# Patient Record
Sex: Female | Born: 1935 | Race: White | Hispanic: No | Marital: Married
Health system: Southern US, Community
[De-identification: ages and names within clinical notes are randomized; demographics above are authoritative.]

---

## 2005-11-30 ENCOUNTER — Inpatient Hospital Stay: Payer: Self-pay | Admitting: Internal Medicine

## 2005-12-02 ENCOUNTER — Other Ambulatory Visit: Payer: Self-pay

## 2005-12-10 ENCOUNTER — Other Ambulatory Visit: Payer: Self-pay

## 2006-02-17 ENCOUNTER — Encounter: Payer: Self-pay | Admitting: Internal Medicine

## 2006-03-07 ENCOUNTER — Ambulatory Visit: Payer: Self-pay | Admitting: Internal Medicine

## 2006-03-12 ENCOUNTER — Encounter: Payer: Self-pay | Admitting: Internal Medicine

## 2006-04-12 ENCOUNTER — Encounter: Payer: Self-pay | Admitting: Internal Medicine

## 2006-05-13 ENCOUNTER — Encounter: Payer: Self-pay | Admitting: Internal Medicine

## 2006-06-11 ENCOUNTER — Encounter: Payer: Self-pay | Admitting: Internal Medicine

## 2006-07-12 ENCOUNTER — Encounter: Payer: Self-pay | Admitting: Internal Medicine

## 2006-08-11 ENCOUNTER — Encounter: Payer: Self-pay | Admitting: Internal Medicine

## 2006-09-11 ENCOUNTER — Encounter: Payer: Self-pay | Admitting: Internal Medicine

## 2006-10-11 ENCOUNTER — Encounter: Payer: Self-pay | Admitting: Internal Medicine

## 2006-11-11 ENCOUNTER — Encounter: Payer: Self-pay | Admitting: Internal Medicine

## 2006-12-12 ENCOUNTER — Encounter: Payer: Self-pay | Admitting: Internal Medicine

## 2007-01-11 ENCOUNTER — Encounter: Payer: Self-pay | Admitting: Internal Medicine

## 2007-02-11 ENCOUNTER — Encounter: Payer: Self-pay | Admitting: Internal Medicine

## 2007-03-13 ENCOUNTER — Encounter: Payer: Self-pay | Admitting: Internal Medicine

## 2007-04-13 ENCOUNTER — Encounter: Payer: Self-pay | Admitting: Internal Medicine

## 2007-05-14 ENCOUNTER — Encounter: Payer: Self-pay | Admitting: Internal Medicine

## 2007-06-11 ENCOUNTER — Encounter: Payer: Self-pay | Admitting: Internal Medicine

## 2007-07-12 ENCOUNTER — Encounter: Payer: Self-pay | Admitting: Internal Medicine

## 2007-08-11 ENCOUNTER — Encounter: Payer: Self-pay | Admitting: Internal Medicine

## 2007-09-11 ENCOUNTER — Encounter: Payer: Self-pay | Admitting: Internal Medicine

## 2007-10-11 ENCOUNTER — Encounter: Payer: Self-pay | Admitting: Internal Medicine

## 2007-11-11 ENCOUNTER — Encounter: Payer: Self-pay | Admitting: Internal Medicine

## 2007-12-12 ENCOUNTER — Encounter: Payer: Self-pay | Admitting: Internal Medicine

## 2008-01-11 ENCOUNTER — Encounter: Payer: Self-pay | Admitting: Internal Medicine

## 2008-02-11 ENCOUNTER — Encounter: Payer: Self-pay | Admitting: Internal Medicine

## 2008-03-12 ENCOUNTER — Encounter: Payer: Self-pay | Admitting: Internal Medicine

## 2008-04-12 ENCOUNTER — Encounter: Payer: Self-pay | Admitting: Internal Medicine

## 2008-05-13 ENCOUNTER — Encounter: Payer: Self-pay | Admitting: Internal Medicine

## 2008-05-29 ENCOUNTER — Ambulatory Visit: Payer: Self-pay | Admitting: Internal Medicine

## 2008-06-10 ENCOUNTER — Encounter: Payer: Self-pay | Admitting: Internal Medicine

## 2008-07-11 ENCOUNTER — Encounter: Payer: Self-pay | Admitting: Internal Medicine

## 2008-08-10 ENCOUNTER — Encounter: Payer: Self-pay | Admitting: Internal Medicine

## 2008-09-10 ENCOUNTER — Encounter: Payer: Self-pay | Admitting: Internal Medicine

## 2008-10-10 ENCOUNTER — Encounter: Payer: Self-pay | Admitting: Internal Medicine

## 2008-11-10 ENCOUNTER — Encounter: Payer: Self-pay | Admitting: Internal Medicine

## 2008-12-11 ENCOUNTER — Encounter: Payer: Self-pay | Admitting: Internal Medicine

## 2009-01-10 ENCOUNTER — Encounter: Payer: Self-pay | Admitting: Internal Medicine

## 2009-02-10 ENCOUNTER — Encounter: Payer: Self-pay | Admitting: Internal Medicine

## 2009-03-12 ENCOUNTER — Encounter: Payer: Self-pay | Admitting: Internal Medicine

## 2009-04-12 ENCOUNTER — Encounter: Payer: Self-pay | Admitting: Internal Medicine

## 2009-05-13 ENCOUNTER — Encounter: Payer: Self-pay | Admitting: Internal Medicine

## 2009-06-10 ENCOUNTER — Encounter: Payer: Self-pay | Admitting: Internal Medicine

## 2009-07-11 ENCOUNTER — Encounter: Payer: Self-pay | Admitting: Internal Medicine

## 2009-08-10 ENCOUNTER — Encounter: Payer: Self-pay | Admitting: Internal Medicine

## 2009-08-12 ENCOUNTER — Ambulatory Visit: Payer: Self-pay | Admitting: Ophthalmology

## 2009-08-26 ENCOUNTER — Ambulatory Visit: Payer: Self-pay | Admitting: Ophthalmology

## 2009-09-10 ENCOUNTER — Encounter: Payer: Self-pay | Admitting: Internal Medicine

## 2009-10-10 ENCOUNTER — Encounter: Payer: Self-pay | Admitting: Internal Medicine

## 2009-11-10 ENCOUNTER — Encounter: Payer: Self-pay | Admitting: Internal Medicine

## 2009-12-11 ENCOUNTER — Encounter: Payer: Self-pay | Admitting: Internal Medicine

## 2010-01-10 ENCOUNTER — Encounter: Payer: Self-pay | Admitting: Internal Medicine

## 2010-02-10 ENCOUNTER — Encounter: Payer: Self-pay | Admitting: Internal Medicine

## 2010-03-12 ENCOUNTER — Encounter: Payer: Self-pay | Admitting: Internal Medicine

## 2010-04-12 ENCOUNTER — Encounter: Payer: Self-pay | Admitting: Internal Medicine

## 2010-05-13 ENCOUNTER — Encounter: Payer: Self-pay | Admitting: Internal Medicine

## 2010-06-11 ENCOUNTER — Encounter: Payer: Self-pay | Admitting: Internal Medicine

## 2010-07-12 ENCOUNTER — Encounter: Payer: Self-pay | Admitting: Internal Medicine

## 2010-08-11 ENCOUNTER — Encounter: Payer: Self-pay | Admitting: Internal Medicine

## 2010-09-11 ENCOUNTER — Encounter: Payer: Self-pay | Admitting: Internal Medicine

## 2010-10-11 ENCOUNTER — Encounter: Payer: Self-pay | Admitting: Internal Medicine

## 2010-11-11 ENCOUNTER — Encounter: Payer: Self-pay | Admitting: Internal Medicine

## 2010-12-12 ENCOUNTER — Encounter: Payer: Self-pay | Admitting: Internal Medicine

## 2011-01-11 ENCOUNTER — Encounter: Payer: Self-pay | Admitting: Internal Medicine

## 2011-02-11 ENCOUNTER — Encounter: Payer: Self-pay | Admitting: Internal Medicine

## 2011-03-13 ENCOUNTER — Encounter: Payer: Self-pay | Admitting: Internal Medicine

## 2011-04-13 ENCOUNTER — Encounter: Payer: Self-pay | Admitting: Internal Medicine

## 2011-05-14 ENCOUNTER — Encounter: Payer: Self-pay | Admitting: Internal Medicine

## 2011-06-11 ENCOUNTER — Encounter: Payer: Self-pay | Admitting: Internal Medicine

## 2011-06-22 LAB — BASIC METABOLIC PANEL
Anion Gap: 10 (ref 7–16)
BUN: 10 mg/dL (ref 7–18)
Chloride: 105 mmol/L (ref 98–107)
Co2: 27 mmol/L (ref 21–32)
Creatinine: 0.8 mg/dL (ref 0.60–1.30)
Osmolality: 282 (ref 275–301)
Potassium: 4 mmol/L (ref 3.5–5.1)
Sodium: 142 mmol/L (ref 136–145)

## 2011-06-22 LAB — LIPID PANEL
Cholesterol: 146 mg/dL (ref 0–200)
HDL Cholesterol: 29 mg/dL — ABNORMAL LOW (ref 40–60)
Ldl Cholesterol, Calc: 91 mg/dL (ref 0–100)
Triglycerides: 128 mg/dL (ref 0–200)

## 2011-06-22 LAB — HEMOGLOBIN A1C: Hemoglobin A1C: 6.3 % (ref 4.2–6.3)

## 2011-07-01 LAB — URINALYSIS, COMPLETE
Glucose,UR: NEGATIVE mg/dL (ref 0–75)
Ketone: NEGATIVE
Nitrite: NEGATIVE
Protein: NEGATIVE
Specific Gravity: 1.02 (ref 1.003–1.030)
WBC UR: 8 /HPF (ref 0–5)

## 2011-07-01 LAB — MAGNESIUM: Magnesium: 1.7 mg/dL — ABNORMAL LOW

## 2011-07-12 ENCOUNTER — Encounter: Payer: Self-pay | Admitting: Internal Medicine

## 2011-08-11 ENCOUNTER — Encounter: Payer: Self-pay | Admitting: Internal Medicine

## 2011-09-11 ENCOUNTER — Encounter: Payer: Self-pay | Admitting: Internal Medicine

## 2011-10-11 ENCOUNTER — Encounter: Payer: Self-pay | Admitting: Internal Medicine

## 2011-11-11 ENCOUNTER — Encounter: Payer: Self-pay | Admitting: Internal Medicine

## 2011-12-12 ENCOUNTER — Encounter: Payer: Self-pay | Admitting: Internal Medicine

## 2012-01-11 ENCOUNTER — Encounter: Payer: Self-pay | Admitting: Internal Medicine

## 2012-01-18 LAB — URINALYSIS, COMPLETE
Bilirubin,UR: NEGATIVE
Glucose,UR: NEGATIVE mg/dL (ref 0–75)
Ketone: NEGATIVE
Protein: NEGATIVE
RBC,UR: NONE SEEN /HPF (ref 0–5)
Specific Gravity: 1.005 (ref 1.003–1.030)
Squamous Epithelial: <1
WBC UR: 8 /HPF (ref 0–5)

## 2012-02-11 ENCOUNTER — Encounter: Payer: Self-pay | Admitting: Internal Medicine

## 2012-03-01 LAB — URINALYSIS, COMPLETE
Bacteria: NONE SEEN
Bilirubin,UR: NEGATIVE
Glucose,UR: NEGATIVE mg/dL (ref 0–75)
Ketone: NEGATIVE
Ph: 7 (ref 4.5–8.0)
RBC,UR: <1 /HPF (ref 0–5)
Specific Gravity: 1.003 (ref 1.003–1.030)
Squamous Epithelial: <1
WBC UR: 1 /HPF (ref 0–5)

## 2012-03-02 LAB — COMPREHENSIVE METABOLIC PANEL
Albumin: 3.3 g/dL — ABNORMAL LOW (ref 3.4–5.0)
Alkaline Phosphatase: 98 U/L (ref 50–136)
Bilirubin,Total: 0.5 mg/dL (ref 0.2–1.0)
Calcium, Total: 8.6 mg/dL (ref 8.5–10.1)
Co2: 30 mmol/L (ref 21–32)
EGFR (Non-African Amer.): 57 — ABNORMAL LOW
Osmolality: 277 (ref 275–301)
Potassium: 4.1 mmol/L (ref 3.5–5.1)
SGPT (ALT): 19 U/L (ref 12–78)

## 2012-03-02 LAB — CBC WITH DIFFERENTIAL/PLATELET
Basophil #: 0.1 10*3/uL (ref 0.0–0.1)
Eosinophil #: 0.3 10*3/uL (ref 0.0–0.7)
Eosinophil %: 2.6 %
HCT: 38.9 % (ref 35.0–47.0)
MCH: 31.2 pg (ref 26.0–34.0)
MCHC: 32.9 g/dL (ref 32.0–36.0)
Monocyte #: 0.9 x10 3/mm (ref 0.2–0.9)
Monocyte %: 7.8 %
Neutrophil #: 8.3 10*3/uL — ABNORMAL HIGH (ref 1.4–6.5)
Neutrophil %: 69.7 %
Platelet: 282 10*3/uL (ref 150–440)
RBC: 4.1 10*6/uL (ref 3.80–5.20)
RDW: 13.8 % (ref 11.5–14.5)

## 2012-03-02 LAB — TSH: Thyroid Stimulating Horm: 3.15 u[IU]/mL

## 2012-03-12 ENCOUNTER — Encounter: Payer: Self-pay | Admitting: Internal Medicine

## 2012-04-12 ENCOUNTER — Encounter: Payer: Self-pay | Admitting: Internal Medicine

## 2012-05-13 ENCOUNTER — Encounter: Payer: Self-pay | Admitting: Internal Medicine

## 2012-06-10 ENCOUNTER — Encounter: Payer: Self-pay | Admitting: Internal Medicine

## 2012-07-11 ENCOUNTER — Encounter: Payer: Self-pay | Admitting: Internal Medicine

## 2012-08-10 ENCOUNTER — Encounter: Payer: Self-pay | Admitting: Internal Medicine

## 2012-09-10 ENCOUNTER — Encounter: Payer: Self-pay | Admitting: Internal Medicine

## 2012-10-10 ENCOUNTER — Encounter: Payer: Self-pay | Admitting: Internal Medicine

## 2012-10-16 ENCOUNTER — Emergency Department: Payer: Self-pay | Admitting: Emergency Medicine

## 2012-10-16 LAB — COMPREHENSIVE METABOLIC PANEL
Albumin: 2.6 g/dL — ABNORMAL LOW (ref 3.4–5.0)
Alkaline Phosphatase: 83 U/L (ref 50–136)
Bilirubin,Total: 0.2 mg/dL (ref 0.2–1.0)
Calcium, Total: 7.1 mg/dL — ABNORMAL LOW (ref 8.5–10.1)
Creatinine: 0.6 mg/dL (ref 0.60–1.30)
EGFR (African American): >60
EGFR (Non-African Amer.): >60
Osmolality: 279 (ref 275–301)
Potassium: 3.4 mmol/L — ABNORMAL LOW (ref 3.5–5.1)
SGPT (ALT): 17 U/L (ref 12–78)

## 2012-10-17 LAB — CBC
HCT: 39.1 % (ref 35.0–47.0)
HGB: 13.1 g/dL (ref 12.0–16.0)
MCH: 30.6 pg (ref 26.0–34.0)
MCV: 92 fL (ref 80–100)
RBC: 4.27 10*6/uL (ref 3.80–5.20)
WBC: 12.1 10*3/uL — ABNORMAL HIGH (ref 3.6–11.0)

## 2012-10-17 LAB — URINALYSIS, COMPLETE
Bilirubin,UR: NEGATIVE
Ph: 6 (ref 4.5–8.0)
RBC,UR: 1 /HPF (ref 0–5)
Specific Gravity: 1.011 (ref 1.003–1.030)
WBC UR: 45 /HPF (ref 0–5)

## 2012-11-10 ENCOUNTER — Encounter: Payer: Self-pay | Admitting: Internal Medicine

## 2012-11-10 LAB — CBC WITH DIFFERENTIAL/PLATELET
Basophil #: 0.1 10*3/uL (ref 0.0–0.1)
Lymphocyte #: 2.2 10*3/uL (ref 1.0–3.6)
Lymphocyte %: 25 %
MCHC: 34.9 g/dL (ref 32.0–36.0)
MCV: 92 fL (ref 80–100)
Neutrophil #: 5.3 10*3/uL (ref 1.4–6.5)
Platelet: 340 10*3/uL (ref 150–440)
WBC: 8.8 10*3/uL (ref 3.6–11.0)

## 2012-11-10 LAB — TSH: Thyroid Stimulating Horm: 4.77 u[IU]/mL — ABNORMAL HIGH

## 2012-11-10 LAB — BASIC METABOLIC PANEL
Anion Gap: 6 — ABNORMAL LOW (ref 7–16)
BUN: 11 mg/dL (ref 7–18)
Calcium, Total: 8.5 mg/dL (ref 8.5–10.1)
Chloride: 105 mmol/L (ref 98–107)
Creatinine: 0.77 mg/dL (ref 0.60–1.30)
EGFR (African American): >60
Osmolality: 276 (ref 275–301)
Potassium: 3.3 mmol/L — ABNORMAL LOW (ref 3.5–5.1)

## 2012-11-10 LAB — FOLATE: Folic Acid: 16 ng/mL (ref 3.1–100.0)

## 2012-11-30 LAB — BASIC METABOLIC PANEL
Anion Gap: 2 — ABNORMAL LOW (ref 7–16)
BUN: 11 mg/dL (ref 7–18)
Creatinine: 0.8 mg/dL (ref 0.60–1.30)
Glucose: 90 mg/dL (ref 65–99)
Osmolality: 275 (ref 275–301)
Potassium: 3.7 mmol/L (ref 3.5–5.1)
Sodium: 138 mmol/L (ref 136–145)

## 2012-12-11 ENCOUNTER — Encounter: Payer: Self-pay | Admitting: Internal Medicine

## 2012-12-20 LAB — URINALYSIS, COMPLETE
Bilirubin,UR: NEGATIVE
Blood: NEGATIVE
Glucose,UR: NEGATIVE mg/dL (ref 0–75)
Hyaline Cast: 2
Nitrite: NEGATIVE
Ph: 6 (ref 4.5–8.0)
Squamous Epithelial: 9
WBC UR: 98 /HPF (ref 0–5)

## 2013-01-10 ENCOUNTER — Encounter: Payer: Self-pay | Admitting: Internal Medicine

## 2013-02-10 ENCOUNTER — Encounter: Payer: Self-pay | Admitting: Internal Medicine

## 2013-02-22 LAB — URINALYSIS, COMPLETE
Glucose,UR: NEGATIVE mg/dL (ref 0–75)
Ketone: NEGATIVE
Ph: 7 (ref 4.5–8.0)
Protein: NEGATIVE
WBC UR: 388 /HPF (ref 0–5)

## 2013-02-24 LAB — URINE CULTURE

## 2013-03-12 ENCOUNTER — Encounter: Payer: Self-pay | Admitting: Internal Medicine

## 2013-04-12 ENCOUNTER — Encounter: Payer: Self-pay | Admitting: Internal Medicine

## 2013-05-13 ENCOUNTER — Encounter: Payer: Self-pay | Admitting: Internal Medicine

## 2013-06-10 ENCOUNTER — Encounter: Payer: Self-pay | Admitting: Internal Medicine

## 2013-07-11 ENCOUNTER — Encounter: Payer: Self-pay | Admitting: Internal Medicine

## 2013-08-10 ENCOUNTER — Encounter: Payer: Self-pay | Admitting: Internal Medicine

## 2013-08-23 LAB — BASIC METABOLIC PANEL
Anion Gap: 5 — ABNORMAL LOW (ref 7–16)
BUN: 11 mg/dL (ref 7–18)
CHLORIDE: 106 mmol/L (ref 98–107)
Calcium, Total: 8.6 mg/dL (ref 8.5–10.1)
Co2: 30 mmol/L (ref 21–32)
Creatinine: 0.85 mg/dL (ref 0.60–1.30)
EGFR (Non-African Amer.): >60
Glucose: 85 mg/dL (ref 65–99)
OSMOLALITY: 280 (ref 275–301)
POTASSIUM: 4.2 mmol/L (ref 3.5–5.1)
Sodium: 141 mmol/L (ref 136–145)

## 2013-09-10 ENCOUNTER — Encounter: Payer: Self-pay | Admitting: Internal Medicine

## 2013-10-10 ENCOUNTER — Encounter: Payer: Self-pay | Admitting: Internal Medicine

## 2013-11-10 ENCOUNTER — Encounter: Payer: Self-pay | Admitting: Internal Medicine

## 2013-12-04 LAB — HEMOGLOBIN A1C: HEMOGLOBIN A1C: 5.4 % (ref 4.2–6.3)

## 2013-12-04 LAB — MAGNESIUM: Magnesium: 1.7 mg/dL — ABNORMAL LOW

## 2013-12-04 LAB — TSH: THYROID STIMULATING HORM: 5.79 u[IU]/mL — AB

## 2013-12-15 ENCOUNTER — Emergency Department: Payer: Self-pay | Admitting: Emergency Medicine

## 2013-12-15 LAB — URINALYSIS, COMPLETE
Bilirubin,UR: NEGATIVE
Glucose,UR: NEGATIVE mg/dL (ref 0–75)
Ketone: NEGATIVE
NITRITE: NEGATIVE
PH: 6 (ref 4.5–8.0)
Specific Gravity: 1.012 (ref 1.003–1.030)
WBC UR: 90 /HPF (ref 0–5)

## 2013-12-15 LAB — CBC
HCT: 40 % (ref 35.0–47.0)
HGB: 13 g/dL (ref 12.0–16.0)
MCH: 31.8 pg (ref 26.0–34.0)
MCHC: 32.6 g/dL (ref 32.0–36.0)
MCV: 98 fL (ref 80–100)
Platelet: 342 10*3/uL (ref 150–440)
RBC: 4.1 10*6/uL (ref 3.80–5.20)
RDW: 14.3 % (ref 11.5–14.5)
WBC: 16.1 10*3/uL — ABNORMAL HIGH (ref 3.6–11.0)

## 2013-12-15 LAB — BASIC METABOLIC PANEL
Anion Gap: 6 — ABNORMAL LOW (ref 7–16)
BUN: 13 mg/dL (ref 7–18)
CO2: 29 mmol/L (ref 21–32)
Calcium, Total: 8.3 mg/dL — ABNORMAL LOW (ref 8.5–10.1)
Chloride: 106 mmol/L (ref 98–107)
Creatinine: 0.85 mg/dL (ref 0.60–1.30)
EGFR (African American): >60
EGFR (Non-African Amer.): >60
Glucose: 97 mg/dL (ref 65–99)
OSMOLALITY: 281 (ref 275–301)
Potassium: 4 mmol/L (ref 3.5–5.1)
Sodium: 141 mmol/L (ref 136–145)

## 2013-12-15 LAB — VALPROIC ACID LEVEL: Valproic Acid: 18 ug/mL — ABNORMAL LOW

## 2013-12-17 LAB — URINE CULTURE

## 2014-01-01 ENCOUNTER — Encounter: Payer: Self-pay | Admitting: Internal Medicine

## 2014-01-01 LAB — CBC WITH DIFFERENTIAL/PLATELET
BASOS ABS: 0.1 10*3/uL (ref 0.0–0.1)
Basophil %: 1 %
Eosinophil #: 0.3 10*3/uL (ref 0.0–0.7)
Eosinophil %: 3.9 %
HCT: 40.2 % (ref 35.0–47.0)
HGB: 12.9 g/dL (ref 12.0–16.0)
LYMPHS ABS: 2.5 10*3/uL (ref 1.0–3.6)
Lymphocyte %: 29.9 %
MCH: 31.2 pg (ref 26.0–34.0)
MCHC: 32 g/dL (ref 32.0–36.0)
MCV: 98 fL (ref 80–100)
MONO ABS: 0.7 x10 3/mm (ref 0.2–0.9)
Monocyte %: 8.7 %
NEUTROS ABS: 4.8 10*3/uL (ref 1.4–6.5)
Neutrophil %: 56.5 %
PLATELETS: 426 10*3/uL (ref 150–440)
RBC: 4.12 10*6/uL (ref 3.80–5.20)
RDW: 13.8 % (ref 11.5–14.5)
WBC: 8.5 10*3/uL (ref 3.6–11.0)

## 2014-01-01 LAB — COMPREHENSIVE METABOLIC PANEL
ALK PHOS: 75 U/L
Albumin: 3.3 g/dL — ABNORMAL LOW (ref 3.4–5.0)
Anion Gap: 4 — ABNORMAL LOW (ref 7–16)
BILIRUBIN TOTAL: 0.3 mg/dL (ref 0.2–1.0)
BUN: 18 mg/dL (ref 7–18)
CALCIUM: 8.4 mg/dL — AB (ref 8.5–10.1)
CHLORIDE: 105 mmol/L (ref 98–107)
CO2: 33 mmol/L — AB (ref 21–32)
Creatinine: 0.8 mg/dL (ref 0.60–1.30)
GLUCOSE: 83 mg/dL (ref 65–99)
Osmolality: 284 (ref 275–301)
Potassium: 3.6 mmol/L (ref 3.5–5.1)
SGOT(AST): 17 U/L (ref 15–37)
SGPT (ALT): 14 U/L
SODIUM: 142 mmol/L (ref 136–145)
TOTAL PROTEIN: 7 g/dL (ref 6.4–8.2)

## 2014-01-10 ENCOUNTER — Encounter: Payer: Self-pay | Admitting: Internal Medicine

## 2014-02-05 LAB — TSH: THYROID STIMULATING HORM: 3.99 u[IU]/mL

## 2014-02-10 ENCOUNTER — Encounter: Payer: Self-pay | Admitting: Internal Medicine

## 2014-03-12 ENCOUNTER — Encounter: Payer: Self-pay | Admitting: Internal Medicine

## 2014-04-12 ENCOUNTER — Encounter: Payer: Self-pay | Admitting: Internal Medicine

## 2014-04-12 ENCOUNTER — Ambulatory Visit
Admit: 2014-04-12 | Disposition: A | Discharge status: Home / Self Care | Payer: Self-pay | Attending: Nurse Practitioner | Admitting: Nurse Practitioner

## 2014-05-02 LAB — URINALYSIS, COMPLETE
Bilirubin,UR: NEGATIVE
Blood: NEGATIVE
GLUCOSE, UR: NEGATIVE mg/dL (ref 0–75)
KETONE: NEGATIVE
NITRITE: NEGATIVE
PH: 5 (ref 4.5–8.0)
Specific Gravity: 1.035 (ref 1.003–1.030)
Squamous Epithelial: 19
WBC UR: 494 /HPF (ref 0–5)

## 2014-05-04 LAB — URINE CULTURE

## 2014-05-13 ENCOUNTER — Encounter: Payer: Self-pay | Admitting: Internal Medicine

## 2014-06-11 ENCOUNTER — Encounter
Admit: 2014-06-11 | Disposition: A | Discharge status: Home / Self Care | Payer: Self-pay | Attending: Internal Medicine | Admitting: Internal Medicine

## 2014-07-12 ENCOUNTER — Encounter
Admit: 2014-07-12 | Disposition: A | Discharge status: Home / Self Care | Payer: Self-pay | Attending: Internal Medicine | Admitting: Internal Medicine

## 2014-09-11 ENCOUNTER — Encounter
Admission: RE | Admit: 2014-09-11 | Discharge: 2014-09-11 | Disposition: A | Discharge status: Home / Self Care | Payer: Medicare Other | Source: Ambulatory Visit | Attending: Internal Medicine | Admitting: Internal Medicine

## 2014-10-09 IMAGING — CT CT HEAD WITHOUT CONTRAST
3 of 4 series · 13 of 27 positions shown, 15 images · non-contrast
Comparison: 11/30/2005 head CT

CLINICAL DATA: Fall and hit head. Knot on back of head. Low back
and left leg pain. Dementia.

EXAM:
CT HEAD WITHOUT CONTRAST
CT CERVICAL SPINE WITHOUT CONTRAST
TECHNIQUE: Multidetector CT imaging of the head and cervical spine was
performed following the standard protocol without intravenous
contrast. Multiplanar CT image reconstructions of the cervical spine
were also generated.

[Series 3: head bone · axial · 0.39mm/px · z∈[-171,-75]mm · 4 of 81 slices shown]
[im 17/81  bone]
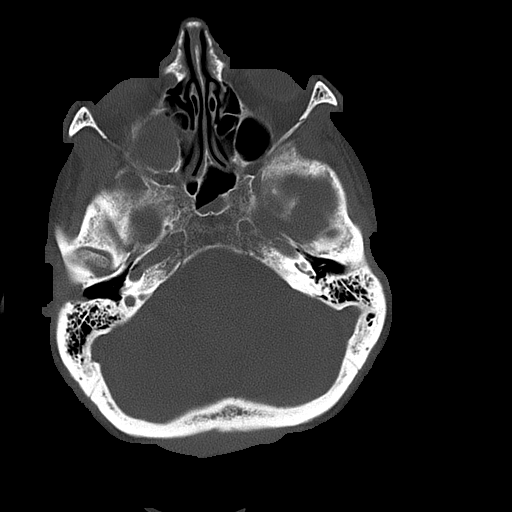
[im 33/81  bone]
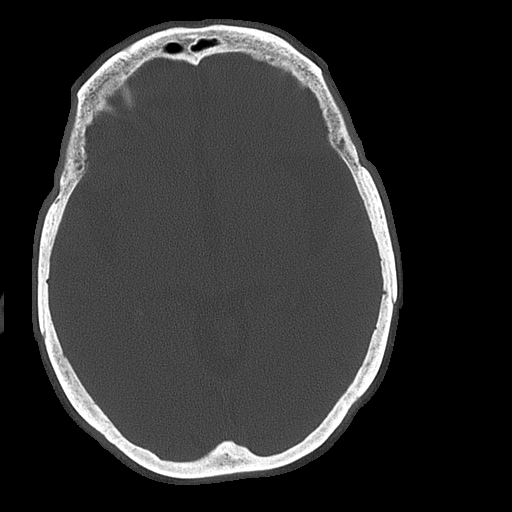
[im 49/81  bone]
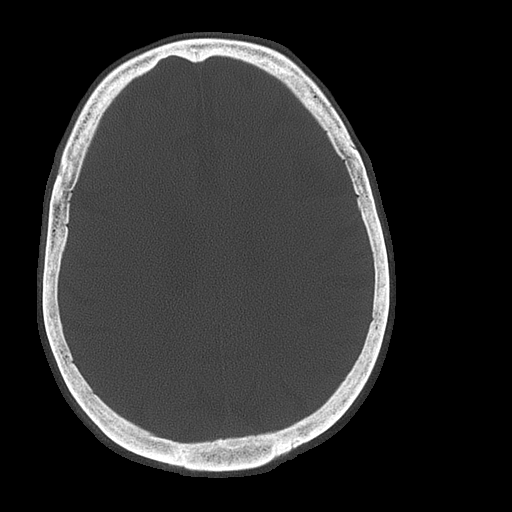
[im 65/81  bone]
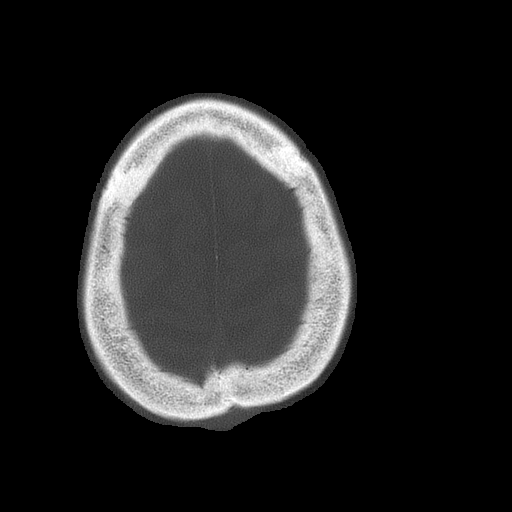

[Series 5: sag bone · sagittal · 0.22mm/px · 5 of 50 slices shown, 6 images]
[im 17/50  bone]
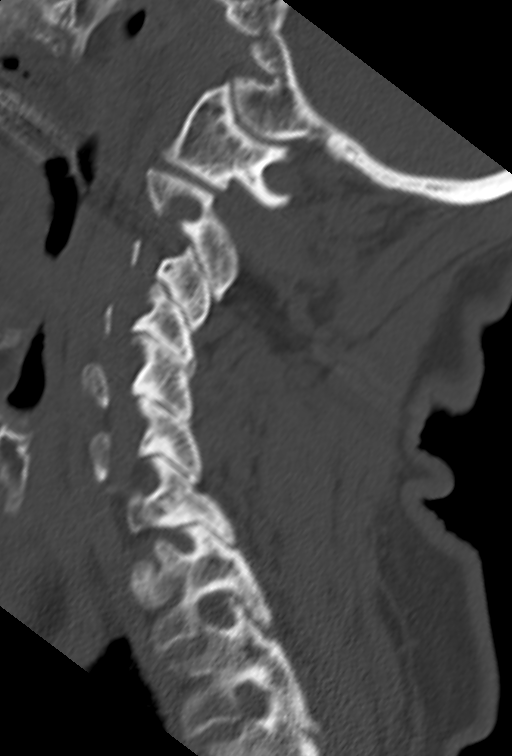
[im 21/50  bone]
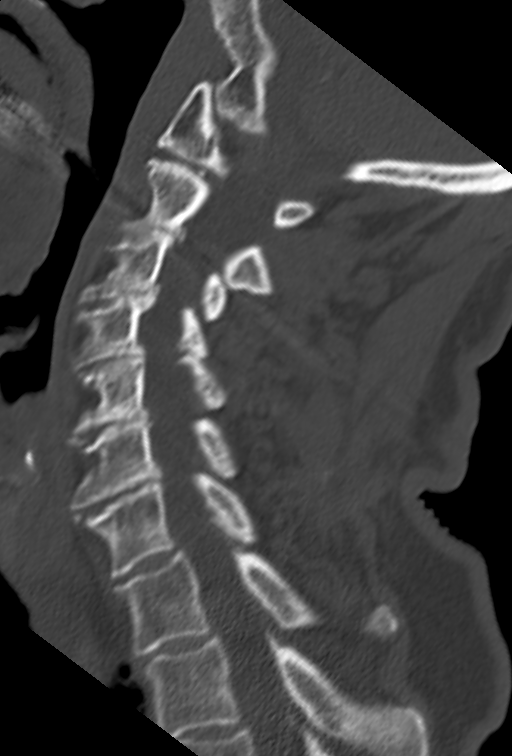
[im 25/50  soft-tissue]
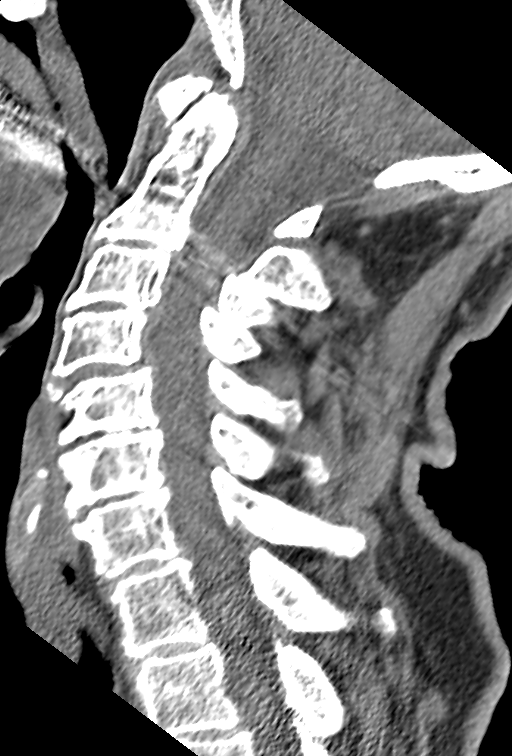
[im 25/50  bone]
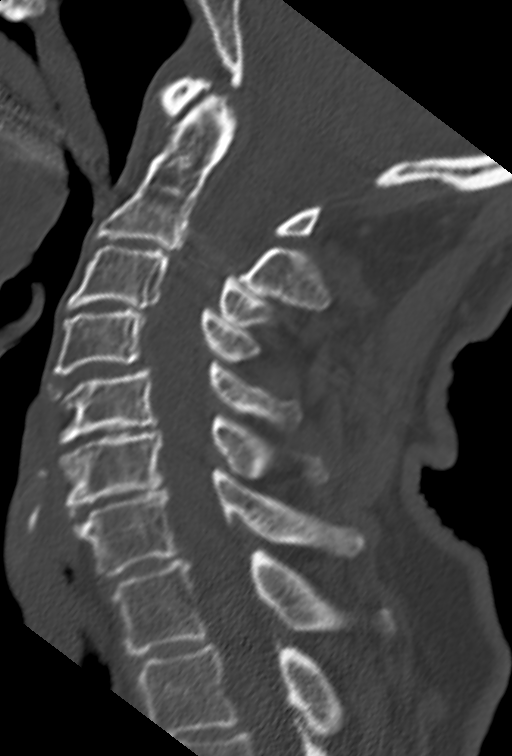
[im 29/50  bone]
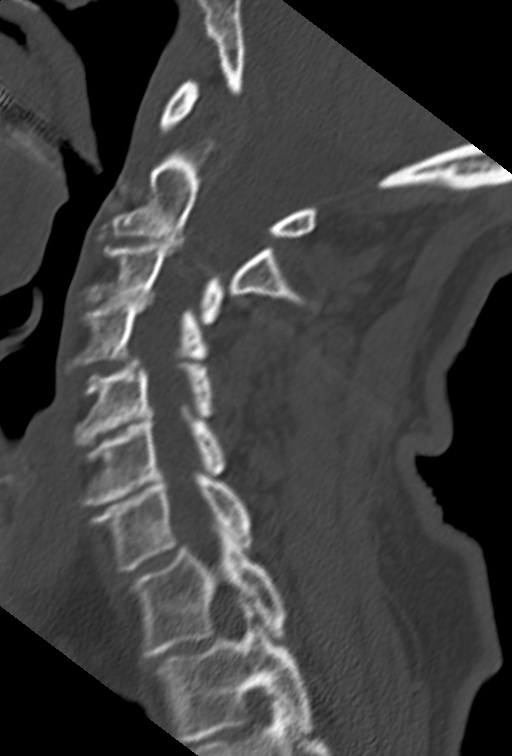
[im 33/50  bone]
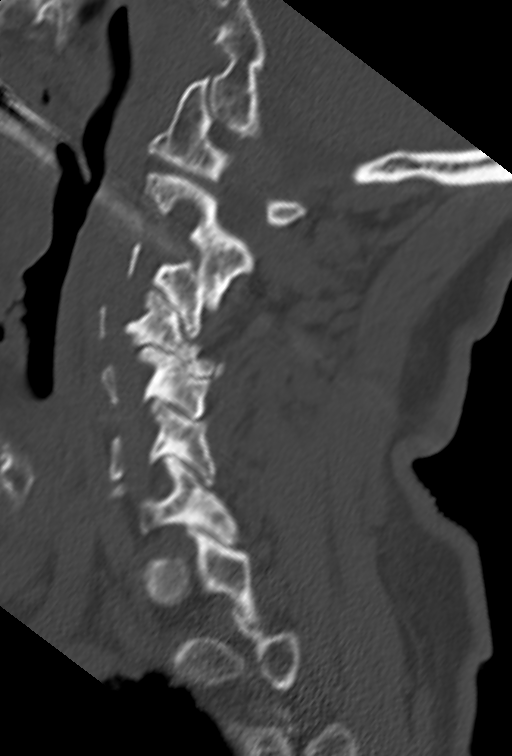

[Series 6: c spine soft · axial · 0.32mm/px · z∈[-282,-198]mm · 4 of 72 slices shown, 5 images]
[im 15/72  soft-tissue]
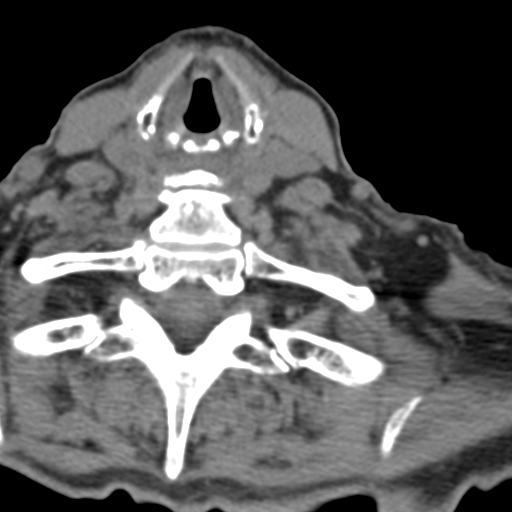
[im 15/72  bone]
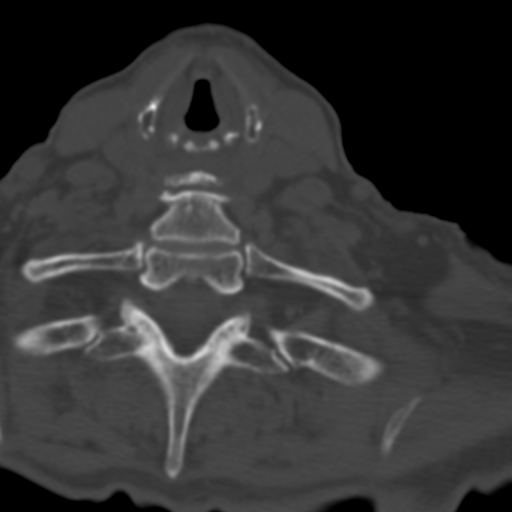
[im 29/72  bone]
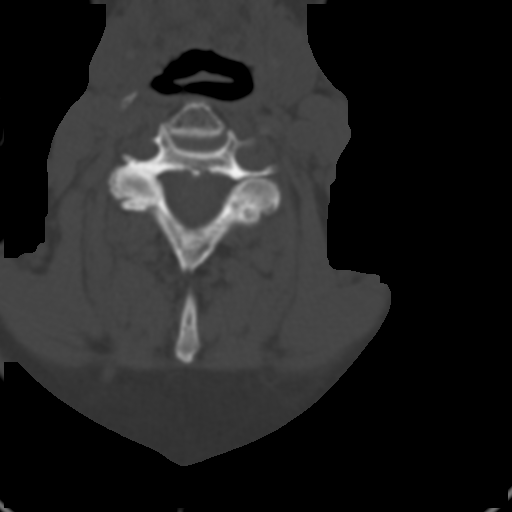
[im 43/72  bone]
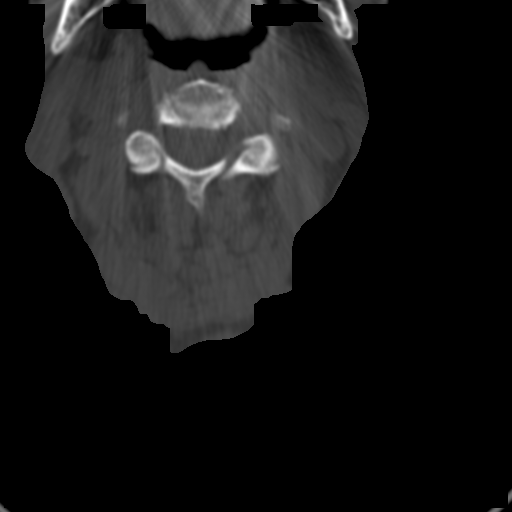
[im 57/72  bone]
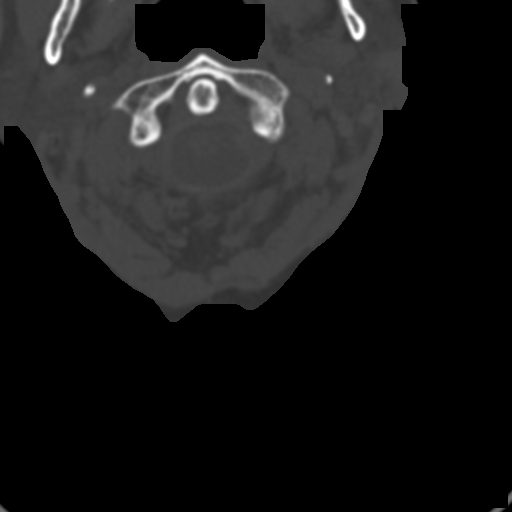

[13 of 27 positions shown; findings below may reference images not displayed]

FINDINGS: CT HEAD FINDINGS

There is no evidence of acute cortical infarct, intracranial
hemorrhage, mass, midline shift, or extra-axial fluid collection.
Patchy hypodensities in the periventricular white matter have
progressed from the prior study and are nonspecific but compatible
with moderate chronic small vessel ischemic disease. There is mild
generalized cerebral atrophy.

Prior bilateral cataract extraction is noted. Minimal bilateral
mastoid fluid is noted. Small parietal scalp swelling/ hematoma is
noted. A small amount of fluid/ secretion is noted in the left
sphenoid sinus. There is near complete opacification of the right
maxillary sinus, also present on the prior CT. No skull fracture is
identified.

CT CERVICAL SPINE FINDINGS

There is slight retrolisthesis of C3 on C4, and there is
approximately 4 mm anterolisthesis of C4 on C5, likely degenerative
due to facet disease. No cervical spine fracture is identified.
Moderate disc space narrowing is present at C5-6 and C6-7 with
associated degenerative endplate osteophyte formation. Mild disc
space narrowing is noted at C2-3 and C3-4 with mild spurring. There
is multilevel facet arthrosis, severe on the left at C4-5. There is
mild biapical scarring.
IMPRESSION: 1. No acute intracranial abnormality.
2. Moderate chronic small vessel ischemic disease, progressed from
the prior CT.
3. No acute osseous abnormality identified in the cervical spine.
4. Advanced multilevel cervical disc degeneration and facet
arthrosis with grade 1 anterolisthesis of C4 on C5.

## 2014-10-11 ENCOUNTER — Encounter
Admission: RE | Admit: 2014-10-11 | Discharge: 2014-10-11 | Disposition: A | Discharge status: Home / Self Care | Payer: Medicare Other | Source: Ambulatory Visit | Attending: Internal Medicine | Admitting: Internal Medicine

## 2014-11-11 ENCOUNTER — Encounter
Admission: RE | Admit: 2014-11-11 | Discharge: 2014-11-11 | Disposition: A | Discharge status: Home / Self Care | Payer: Medicare Other | Source: Ambulatory Visit | Attending: Internal Medicine | Admitting: Internal Medicine

## 2014-12-08 ENCOUNTER — Other Ambulatory Visit
Admission: RE | Admit: 2014-12-08 | Discharge: 2014-12-08 | Disposition: A | Discharge status: Home / Self Care | Payer: Medicare Other | Source: Ambulatory Visit | Attending: Internal Medicine | Admitting: Internal Medicine

## 2014-12-08 DIAGNOSIS — R5383 Other fatigue: Secondary | ICD-10-CM | POA: Insufficient documentation

## 2014-12-08 DIAGNOSIS — R410 Disorientation, unspecified: Secondary | ICD-10-CM | POA: Diagnosis not present

## 2014-12-08 DIAGNOSIS — R8290 Unspecified abnormal findings in urine: Secondary | ICD-10-CM | POA: Insufficient documentation

## 2014-12-08 LAB — URINALYSIS COMPLETE WITH MICROSCOPIC (ARMC ONLY)
BILIRUBIN URINE: NEGATIVE
Glucose, UA: NEGATIVE mg/dL
Ketones, ur: NEGATIVE mg/dL
Nitrite: POSITIVE — AB
PH: 6 (ref 5.0–8.0)
PROTEIN: NEGATIVE mg/dL
Specific Gravity, Urine: 1.021 (ref 1.005–1.030)

## 2014-12-11 LAB — URINE CULTURE: Culture: >=100000

## 2014-12-12 ENCOUNTER — Encounter
Admission: RE | Admit: 2014-12-12 | Discharge: 2014-12-12 | Disposition: A | Discharge status: Home / Self Care | Payer: Medicare Other | Source: Ambulatory Visit | Attending: Internal Medicine | Admitting: Internal Medicine

## 2015-01-11 ENCOUNTER — Encounter
Admission: RE | Admit: 2015-01-11 | Discharge: 2015-01-11 | Disposition: A | Discharge status: Home / Self Care | Payer: Medicare Other | Source: Ambulatory Visit | Attending: Internal Medicine | Admitting: Internal Medicine

## 2015-01-11 DIAGNOSIS — E039 Hypothyroidism, unspecified: Secondary | ICD-10-CM | POA: Insufficient documentation

## 2015-01-11 DIAGNOSIS — F419 Anxiety disorder, unspecified: Secondary | ICD-10-CM | POA: Insufficient documentation

## 2015-01-14 DIAGNOSIS — E039 Hypothyroidism, unspecified: Secondary | ICD-10-CM | POA: Diagnosis not present

## 2015-01-14 DIAGNOSIS — F419 Anxiety disorder, unspecified: Secondary | ICD-10-CM | POA: Diagnosis not present

## 2015-01-14 LAB — CBC WITH DIFFERENTIAL/PLATELET
BASOS ABS: 0.1 10*3/uL (ref 0–0.1)
BASOS PCT: 1 %
EOS PCT: 3 %
Eosinophils Absolute: 0.3 10*3/uL (ref 0–0.7)
HCT: 34.6 % — ABNORMAL LOW (ref 35.0–47.0)
Hemoglobin: 11.1 g/dL — ABNORMAL LOW (ref 12.0–16.0)
LYMPHS PCT: 18 %
Lymphs Abs: 2.2 10*3/uL (ref 1.0–3.6)
MCH: 27.1 pg (ref 26.0–34.0)
MCHC: 32.1 g/dL (ref 32.0–36.0)
MCV: 84.5 fL (ref 80.0–100.0)
MONO ABS: 1.1 10*3/uL — AB (ref 0.2–0.9)
Monocytes Relative: 9 %
NEUTROS ABS: 9 10*3/uL — AB (ref 1.4–6.5)
Neutrophils Relative %: 69 %
PLATELETS: 337 10*3/uL (ref 150–440)
RBC: 4.09 MIL/uL (ref 3.80–5.20)
RDW: 16.2 % — AB (ref 11.5–14.5)
WBC: 12.8 10*3/uL — AB (ref 3.6–11.0)

## 2015-01-14 LAB — TSH: TSH: 21.706 u[IU]/mL — ABNORMAL HIGH (ref 0.350–4.500)

## 2015-01-14 LAB — COMPREHENSIVE METABOLIC PANEL
ALBUMIN: 3.7 g/dL (ref 3.5–5.0)
ALT: 12 U/L — ABNORMAL LOW (ref 14–54)
ANION GAP: 8 (ref 5–15)
AST: 18 U/L (ref 15–41)
Alkaline Phosphatase: 66 U/L (ref 38–126)
BUN: 15 mg/dL (ref 6–20)
CHLORIDE: 105 mmol/L (ref 101–111)
CO2: 27 mmol/L (ref 22–32)
Calcium: 8.9 mg/dL (ref 8.9–10.3)
Creatinine, Ser: 0.7 mg/dL (ref 0.44–1.00)
GFR calc Af Amer: >60 mL/min (ref >60)
GFR calc non Af Amer: >60 mL/min (ref >60)
GLUCOSE: 84 mg/dL (ref 65–99)
POTASSIUM: 3.9 mmol/L (ref 3.5–5.1)
SODIUM: 140 mmol/L (ref 135–145)
Total Bilirubin: 0.7 mg/dL (ref 0.3–1.2)
Total Protein: 6.6 g/dL (ref 6.5–8.1)

## 2015-01-14 LAB — MAGNESIUM: Magnesium: 2.1 mg/dL (ref 1.7–2.4)

## 2015-01-14 LAB — VITAMIN B12: Vitamin B-12: 596 pg/mL (ref 180–914)

## 2015-01-18 LAB — VITAMIN D 1,25 DIHYDROXY
VITAMIN D 1, 25 (OH) TOTAL: 41 pg/mL
Vitamin D3 1, 25 (OH)2: 41 pg/mL

## 2015-02-11 ENCOUNTER — Encounter
Admission: RE | Admit: 2015-02-11 | Discharge: 2015-02-11 | Disposition: A | Discharge status: Home / Self Care | Payer: Medicare Other | Source: Ambulatory Visit | Attending: Internal Medicine | Admitting: Internal Medicine

## 2015-02-11 DIAGNOSIS — F419 Anxiety disorder, unspecified: Secondary | ICD-10-CM | POA: Insufficient documentation

## 2015-02-11 DIAGNOSIS — E039 Hypothyroidism, unspecified: Secondary | ICD-10-CM | POA: Insufficient documentation

## 2015-02-25 DIAGNOSIS — E039 Hypothyroidism, unspecified: Secondary | ICD-10-CM | POA: Diagnosis present

## 2015-02-25 DIAGNOSIS — F419 Anxiety disorder, unspecified: Secondary | ICD-10-CM | POA: Diagnosis not present

## 2015-02-25 LAB — TSH: TSH: 14.501 u[IU]/mL — AB (ref 0.350–4.500)

## 2015-03-13 ENCOUNTER — Encounter
Admission: RE | Admit: 2015-03-13 | Discharge: 2015-03-13 | Disposition: A | Discharge status: Home / Self Care | Payer: Medicare Other | Source: Ambulatory Visit | Attending: Internal Medicine | Admitting: Internal Medicine

## 2015-03-13 DIAGNOSIS — E039 Hypothyroidism, unspecified: Secondary | ICD-10-CM | POA: Insufficient documentation

## 2015-04-08 DIAGNOSIS — E039 Hypothyroidism, unspecified: Secondary | ICD-10-CM | POA: Diagnosis not present

## 2015-04-08 LAB — TSH: TSH: 13.078 u[IU]/mL — AB (ref 0.350–4.500)

## 2015-04-13 ENCOUNTER — Encounter
Admission: RE | Admit: 2015-04-13 | Discharge: 2015-04-13 | Disposition: A | Discharge status: Home / Self Care | Payer: Medicare Other | Source: Ambulatory Visit | Attending: Internal Medicine | Admitting: Internal Medicine

## 2015-04-13 DIAGNOSIS — D649 Anemia, unspecified: Secondary | ICD-10-CM | POA: Insufficient documentation

## 2015-04-13 DIAGNOSIS — F329 Major depressive disorder, single episode, unspecified: Secondary | ICD-10-CM | POA: Insufficient documentation

## 2015-04-22 DIAGNOSIS — D649 Anemia, unspecified: Secondary | ICD-10-CM | POA: Diagnosis present

## 2015-04-22 DIAGNOSIS — F329 Major depressive disorder, single episode, unspecified: Secondary | ICD-10-CM | POA: Diagnosis not present

## 2015-04-22 LAB — COMPREHENSIVE METABOLIC PANEL
ALK PHOS: 58 U/L (ref 38–126)
ALT: 8 U/L — AB (ref 14–54)
AST: 14 U/L — ABNORMAL LOW (ref 15–41)
Albumin: 4 g/dL (ref 3.5–5.0)
Anion gap: 8 (ref 5–15)
BUN: 17 mg/dL (ref 6–20)
CALCIUM: 9.1 mg/dL (ref 8.9–10.3)
CO2: 28 mmol/L (ref 22–32)
CREATININE: 0.53 mg/dL (ref 0.44–1.00)
Chloride: 106 mmol/L (ref 101–111)
Glucose, Bld: 77 mg/dL (ref 65–99)
Potassium: 4.2 mmol/L (ref 3.5–5.1)
Sodium: 142 mmol/L (ref 135–145)
Total Bilirubin: 0.6 mg/dL (ref 0.3–1.2)
Total Protein: 6.9 g/dL (ref 6.5–8.1)

## 2015-04-22 LAB — CBC WITH DIFFERENTIAL/PLATELET
Basophils Absolute: 0.1 10*3/uL (ref 0–0.1)
Basophils Relative: 1 %
EOS PCT: 4 %
Eosinophils Absolute: 0.3 10*3/uL (ref 0–0.7)
HCT: 32.4 % — ABNORMAL LOW (ref 35.0–47.0)
HEMOGLOBIN: 10.1 g/dL — AB (ref 12.0–16.0)
LYMPHS ABS: 2.2 10*3/uL (ref 1.0–3.6)
LYMPHS PCT: 28 %
MCH: 24.8 pg — AB (ref 26.0–34.0)
MCHC: 31.3 g/dL — AB (ref 32.0–36.0)
MCV: 79.3 fL — AB (ref 80.0–100.0)
MONOS PCT: 8 %
Monocytes Absolute: 0.7 10*3/uL (ref 0.2–0.9)
NEUTROS PCT: 59 %
Neutro Abs: 4.8 10*3/uL (ref 1.4–6.5)
Platelets: 369 10*3/uL (ref 150–440)
RBC: 4.09 MIL/uL (ref 3.80–5.20)
RDW: 15.1 % — ABNORMAL HIGH (ref 11.5–14.5)
WBC: 8.1 10*3/uL (ref 3.6–11.0)

## 2015-05-14 ENCOUNTER — Encounter
Admission: RE | Admit: 2015-05-14 | Discharge: 2015-05-14 | Disposition: A | Discharge status: Home / Self Care | Payer: Medicare Other | Source: Ambulatory Visit | Attending: Internal Medicine | Admitting: Internal Medicine

## 2015-05-14 DIAGNOSIS — R197 Diarrhea, unspecified: Secondary | ICD-10-CM | POA: Insufficient documentation

## 2015-05-14 DIAGNOSIS — E039 Hypothyroidism, unspecified: Secondary | ICD-10-CM | POA: Insufficient documentation

## 2015-05-14 DIAGNOSIS — R195 Other fecal abnormalities: Secondary | ICD-10-CM | POA: Insufficient documentation

## 2015-05-29 DIAGNOSIS — R195 Other fecal abnormalities: Secondary | ICD-10-CM | POA: Diagnosis present

## 2015-05-29 DIAGNOSIS — E039 Hypothyroidism, unspecified: Secondary | ICD-10-CM | POA: Diagnosis not present

## 2015-05-29 DIAGNOSIS — R197 Diarrhea, unspecified: Secondary | ICD-10-CM | POA: Diagnosis present

## 2015-05-29 LAB — C DIFFICILE QUICK SCREEN W PCR REFLEX
C DIFFICILE (CDIFF) INTERP: NEGATIVE
C DIFFICILE (CDIFF) TOXIN: NEGATIVE
C DIFFICLE (CDIFF) ANTIGEN: NEGATIVE

## 2015-06-05 DIAGNOSIS — E039 Hypothyroidism, unspecified: Secondary | ICD-10-CM | POA: Diagnosis not present

## 2015-06-05 LAB — TSH: TSH: 4.278 u[IU]/mL (ref 0.350–4.500)

## 2015-06-11 ENCOUNTER — Encounter
Admission: RE | Admit: 2015-06-11 | Discharge: 2015-06-11 | Disposition: A | Discharge status: Home / Self Care | Payer: Medicare Other | Source: Ambulatory Visit | Attending: Internal Medicine | Admitting: Internal Medicine

## 2015-06-17 ENCOUNTER — Other Ambulatory Visit
Admission: RE | Admit: 2015-06-17 | Discharge: 2015-06-17 | Disposition: A | Discharge status: Home / Self Care | Payer: Medicare Other | Source: Skilled Nursing Facility | Attending: Internal Medicine | Admitting: Internal Medicine

## 2015-06-17 DIAGNOSIS — R69 Illness, unspecified: Secondary | ICD-10-CM | POA: Diagnosis present

## 2015-06-17 LAB — URINALYSIS COMPLETE WITH MICROSCOPIC (ARMC ONLY)
Bilirubin Urine: NEGATIVE
Glucose, UA: NEGATIVE mg/dL
HGB URINE DIPSTICK: NEGATIVE
KETONES UR: NEGATIVE mg/dL
Nitrite: NEGATIVE
PH: 6 (ref 5.0–8.0)
PROTEIN: 30 mg/dL — AB
SPECIFIC GRAVITY, URINE: 1.024 (ref 1.005–1.030)

## 2015-06-19 LAB — URINE CULTURE

## 2015-07-12 ENCOUNTER — Encounter
Admission: RE | Admit: 2015-07-12 | Discharge: 2015-07-12 | Disposition: A | Discharge status: Home / Self Care | Payer: Medicare Other | Source: Ambulatory Visit | Attending: Internal Medicine | Admitting: Internal Medicine

## 2015-08-11 ENCOUNTER — Encounter
Admission: RE | Admit: 2015-08-11 | Discharge: 2015-08-11 | Disposition: A | Discharge status: Home / Self Care | Payer: Medicare Other | Source: Ambulatory Visit | Attending: Internal Medicine | Admitting: Internal Medicine

## 2015-09-11 ENCOUNTER — Encounter
Admission: RE | Admit: 2015-09-11 | Discharge: 2015-09-11 | Disposition: A | Discharge status: Home / Self Care | Source: Ambulatory Visit | Attending: Internal Medicine | Admitting: Internal Medicine

## 2015-09-11 DIAGNOSIS — F329 Major depressive disorder, single episode, unspecified: Secondary | ICD-10-CM | POA: Insufficient documentation

## 2015-09-12 DIAGNOSIS — F329 Major depressive disorder, single episode, unspecified: Secondary | ICD-10-CM | POA: Diagnosis present

## 2015-09-12 LAB — COMPREHENSIVE METABOLIC PANEL
ALT: 76 U/L — ABNORMAL HIGH (ref 14–54)
AST: 74 U/L — AB (ref 15–41)
Albumin: 3.4 g/dL — ABNORMAL LOW (ref 3.5–5.0)
Alkaline Phosphatase: 335 U/L — ABNORMAL HIGH (ref 38–126)
Anion gap: 8 (ref 5–15)
BUN: 24 mg/dL — ABNORMAL HIGH (ref 6–20)
CHLORIDE: 103 mmol/L (ref 101–111)
CO2: 29 mmol/L (ref 22–32)
Calcium: 9.8 mg/dL (ref 8.9–10.3)
Creatinine, Ser: 0.83 mg/dL (ref 0.44–1.00)
GFR, EST NON AFRICAN AMERICAN: 59 mL/min — AB (ref >60)
Glucose, Bld: 149 mg/dL — ABNORMAL HIGH (ref 65–99)
POTASSIUM: 3.9 mmol/L (ref 3.5–5.1)
SODIUM: 140 mmol/L (ref 135–145)
Total Bilirubin: 0.6 mg/dL (ref 0.3–1.2)
Total Protein: 6.2 g/dL — ABNORMAL LOW (ref 6.5–8.1)

## 2015-09-12 LAB — CBC WITH DIFFERENTIAL/PLATELET
Basophils Absolute: 0.1 10*3/uL (ref 0–0.1)
Basophils Relative: 1 %
EOS ABS: 0.5 10*3/uL (ref 0–0.7)
EOS PCT: 8 %
HCT: 36.7 % (ref 35.0–47.0)
Hemoglobin: 11.7 g/dL — ABNORMAL LOW (ref 12.0–16.0)
LYMPHS ABS: 1.7 10*3/uL (ref 1.0–3.6)
LYMPHS PCT: 28 %
MCH: 27.1 pg (ref 26.0–34.0)
MCHC: 32 g/dL (ref 32.0–36.0)
MCV: 84.7 fL (ref 80.0–100.0)
MONO ABS: 0.4 10*3/uL (ref 0.2–0.9)
Monocytes Relative: 7 %
Neutro Abs: 3.5 10*3/uL (ref 1.4–6.5)
Neutrophils Relative %: 56 %
PLATELETS: 277 10*3/uL (ref 150–440)
RBC: 4.33 MIL/uL (ref 3.80–5.20)
RDW: 15.3 % — AB (ref 11.5–14.5)
WBC: 6.2 10*3/uL (ref 3.6–11.0)

## 2015-10-11 ENCOUNTER — Encounter
Admission: RE | Admit: 2015-10-11 | Discharge: 2015-10-11 | Disposition: A | Discharge status: Home / Self Care | Source: Ambulatory Visit | Attending: Internal Medicine | Admitting: Internal Medicine

## 2015-10-11 DIAGNOSIS — R197 Diarrhea, unspecified: Secondary | ICD-10-CM | POA: Insufficient documentation

## 2015-10-27 ENCOUNTER — Non-Acute Institutional Stay (SKILLED_NURSING_FACILITY): Payer: Medicare Other | Admitting: Gerontology

## 2015-10-27 DIAGNOSIS — F0390 Unspecified dementia without behavioral disturbance: Secondary | ICD-10-CM | POA: Diagnosis not present

## 2015-10-27 DIAGNOSIS — G20 Parkinson's disease: Secondary | ICD-10-CM

## 2015-10-27 NOTE — Progress Notes (Signed)
Location:  [facility]   Place of Service:  SNF [phone](31) Provider:  Lorenso QuarryShannon Charlsey Moragne, NP-C  No primary care provider on file.  No care team member to display  Extended Emergency Contact Information Primary Emergency Contact: [facility] K Address: [address]          [place], [place] [address] Home Phone: [phone] Relation: None  Code Status:  DNR Goals of care: Advanced Directive information No flowsheet data found.   Chief Complaint  Patient presents with  . Medical Management of Chronic Issues    HPI:  Pt is a 82 y.o. female seen today for medical management of chronic diseases. She has diagnoses of Parkinson' Disease and Senile Dementia. There is no medical treatment for the Parkinson's at this time as the tremors are at a minimal. The Senile Dementia has manifestations of mood disorders. Pt is managed for comfort of herself and the staff for the mood disorder. She has no complaints. Denies pain    No past medical history on file. No past surgical history on file.  Allergies not on file    Medication List    Notice  As of 10/27/2015  4:13 PM   You have not been prescribed any medications.      Review of Systems  Unable to perform ROS: Dementia  Constitutional: Positive for fatigue. Negative for activity change, appetite change and unexpected weight change.  HENT: Negative.   Respiratory: Negative for cough, choking, chest tightness and shortness of breath.   Cardiovascular: Negative for chest pain, palpitations and leg swelling.  Gastrointestinal: Negative.   Genitourinary: Negative.   Musculoskeletal: Negative.   Skin: Positive for pallor.  Neurological: Negative.   Psychiatric/Behavioral: Positive for agitation (baseline). Behavioral problem: baseline.  All other systems reviewed and are negative.    There is no immunization history on file for this patient. There are no preventive care reminders to display for this patient. No  flowsheet data found. Functional Status Survey:    Filed Vitals:   10/27/15 1609  BP: 105/67  Pulse: 77  Temp: 97 F (36.1 C)  Resp: 18  SpO2: 96%   There is no height or weight on file to calculate BMI. Physical Exam  Constitutional: She is oriented to person, place, and time. She appears well-developed and well-nourished. No distress.  HENT:  Head: Normocephalic and atraumatic.  Eyes: Conjunctivae and EOM are normal. Pupils are equal, round, and reactive to light.  Neck: Normal range of motion. No JVD present.  Cardiovascular: Normal rate, regular rhythm and intact distal pulses.  Exam reveals friction rub. Exam reveals no gallop.   No murmur heard. Pulmonary/Chest: Effort normal and breath sounds normal. No respiratory distress. She has no wheezes. She has no rales. She exhibits no tenderness.  Abdominal: Soft. Bowel sounds are normal. She exhibits no distension.  Musculoskeletal: Normal range of motion. She exhibits no edema or tenderness.  Lymphadenopathy:    She has no cervical adenopathy.  Neurological: She is alert and oriented to person, place, and time.  Skin: Skin is warm and dry. No rash noted. She is not diaphoretic. No erythema. No pallor.  Psychiatric: She has a normal mood and affect. Her behavior is normal. Judgment and thought content normal.  Nursing note and vitals reviewed.   Labs reviewed:  Recent Labs  01/14/15 0904 04/22/15 0933 09/12/15 0915  NA 140 142 140  K 3.9 4.2 3.9  CL 105 106 103  CO2 27 28 29   GLUCOSE 84 77  149*  BUN 15 17 24*  CREATININE 0.70 0.53 0.83  CALCIUM 8.9 9.1 9.8  MG 2.1  --   --     Recent Labs  01/14/15 0904 04/22/15 0933 09/12/15 0915  AST 18 14* 74*  ALT 12* 8* 76*  ALKPHOS 66 58 335*  BILITOT 0.7 0.6 0.6  PROT 6.6 6.9 6.2*  ALBUMIN 3.7 4.0 3.4*    Recent Labs  01/14/15 0904 04/22/15 0933 09/12/15 0915  WBC 12.8* 8.1 6.2  NEUTROABS 9.0* 4.8 3.5  HGB 11.1* 10.1* 11.7*  HCT 34.6* 32.4* 36.7  MCV  84.5 79.3* 84.7  PLT 337 369 277   Lab Results  Component Value Date   TSH 4.278 06/05/2015   Lab Results  Component Value Date   HGBA1C 5.4 12/04/2013   Lab Results  Component Value Date   CHOL 146 06/22/2011   HDL 29* 06/22/2011   LDLCALC 91 06/22/2011   TRIG 128 06/22/2011    Significant Diagnostic Results in last 30 days:  No results found.  Assessment/Plan 1. Parkinson disease (HCC)  Minimal tremors  No treatment at this time  Monitor for worsening symptoms  2. Senile dementia, uncomplicated  Abilify 7 mg po Q day  Melatonin 3 mg po Q Hs for sleep cycle    Family/ staff Communication:   Total Time: 25 minutes  Documentation: 15 minutes  Face to Face: 10 minutes  Family/Phone:   Labs/tests ordered:  none   Brynda Rim, NP-C Geriatrics [facility] [address], [place] [address] Cell Phone (Mon-Fri 8am-5pm):  [phone] On Call:  [phone] & follow prompts after 5pm & weekends Office Phone:  [phone] Office Fax:  [phone]

## 2015-10-30 ENCOUNTER — Non-Acute Institutional Stay (SKILLED_NURSING_FACILITY): Payer: Medicare Other | Admitting: Gerontology

## 2015-10-30 DIAGNOSIS — J189 Pneumonia, unspecified organism: Secondary | ICD-10-CM

## 2015-10-30 NOTE — Progress Notes (Signed)
Location:  [facility]   Place of Service:  SNF [phone](31) Provider:  Lorenso QuarryShannon Cliff Damiani, NP-C  No primary care provider on file.  No care team member to display  Extended Emergency Contact Information Primary Emergency Contact: [facility]JOHN K Address: [address]          [place], [place] [address] Home Phone: [phone] Relation: None  Code Status: DNR Goals of care: Advanced Directive information No flowsheet data found.   Chief Complaint  Patient presents with  . Acute Visit    HPI:  Pt is a 82 y.o. female seen today for an acute visit for difficulty breathing. Pt started experiencing dyspnea this morning. Lung fields were clear on previous assessment (Monday) and yesterday by the Hospice RN. Res denies chest pain. Does endorse productive cough, with thick green sputum. Res also states she "just doesn't feel good."     No past medical history on file. No past surgical history on file.  Allergies not on file    Medication List    Notice  As of 10/30/2015 10:44 PM   You have not been prescribed any medications.      Review of Systems  Constitutional: Positive for activity change and fatigue.  HENT: Positive for congestion and postnasal drip. Negative for rhinorrhea, sinus pressure and sneezing.   Eyes: Negative.   Respiratory: Positive for cough and shortness of breath. Negative for choking, chest tightness and wheezing.   Cardiovascular: Negative for chest pain and leg swelling.  Gastrointestinal: Negative.   Genitourinary: Negative.   Musculoskeletal: Negative.   Skin: Negative.   Neurological: Negative.   Psychiatric/Behavioral: Negative.   All other systems reviewed and are negative.    There is no immunization history on file for this patient. Pertinent  Health Maintenance Due  Topic Date Due  . DEXA SCAN  12/15/1987  . PNA vac Low Risk Adult (1 of 2 - PCV13) 12/15/1987  . INFLUENZA VACCINE  11/11/2015   No flowsheet data  found. Functional Status Survey:    Filed Vitals:   10/30/15 1721  BP: 147/79  Pulse: 66  Temp: 97 F (36.1 C)  Resp: 16  SpO2: 100%   There is no height or weight on file to calculate BMI. Physical Exam  Constitutional: She is oriented to person, place, and time. She appears well-developed and well-nourished. No distress.  HENT:  Head: Normocephalic and atraumatic.  Mouth/Throat: Oropharynx is clear and moist.  Eyes: Conjunctivae are normal. Pupils are equal, round, and reactive to light.  Neck: Normal range of motion. Neck supple. No JVD present. No tracheal deviation present.  Cardiovascular: Normal rate, regular rhythm and intact distal pulses.  Exam reveals no gallop and no friction rub.   No murmur heard. Pulmonary/Chest: Effort normal. No respiratory distress. She has no wheezes. She has rales ([place]). She exhibits no tenderness.  Abdominal: Soft. Bowel sounds are normal. She exhibits no distension. There is no tenderness. There is no guarding.  Musculoskeletal: Normal range of motion. She exhibits edema. She exhibits no tenderness.  Lymphadenopathy:    She has no cervical adenopathy.  Neurological: She is alert and oriented to person, place, and time.  Skin: Skin is warm and dry. No rash noted. She is not diaphoretic. No erythema. No pallor.  Psychiatric: She has a normal mood and affect. Her behavior is normal. Judgment and thought content normal.  Nursing note and vitals reviewed.   Labs reviewed:  Recent Labs  01/14/15 0904 04/22/15 0933 09/12/15 0915  NA 140 142 140  K 3.9 4.2 3.9  CL 105 106 103  CO2 GLUCOSE 84 77 149*  BUN 15 17 24*  CREATININE 0.70 0.53 0.83  CALCIUM 8.9 9.1 9.8  MG 2.1  --   --     Recent Labs  01/14/15 0904 04/22/15 0933 09/12/15 0915  AST 18 14* 74*  ALT 12* 8* 76*  ALKPHOS 66 58 335*  BILITOT 0.7 0.6 0.6  PROT 6.6 6.9 6.2*  ALBUMIN 3.7 4.0 3.4*    Recent Labs  01/14/15 0904 04/22/15 0933  09/12/15 0915  WBC 12.8* 8.1 6.2  NEUTROABS 9.0* 4.8 3.5  HGB 11.1* 10.1* 11.7*  HCT 34.6* 32.4* 36.7  MCV 84.5 79.3* 84.7  PLT 337 369 277   Lab Results  Component Value Date   TSH 4.278 06/05/2015   Lab Results  Component Value Date   HGBA1C 5.4 12/04/2013   Lab Results  Component Value Date   CHOL 146 06/22/2011   HDL 29* 06/22/2011   LDLCALC 91 06/22/2011   TRIG 128 06/22/2011    Significant Diagnostic Results in last 30 days:  No results found.  Assessment/Plan 1. HCAP (healthcare-associated pneumonia)  Rocephin 1 gram IM x 1 now. Reconstitute with Lidocaine  Augmentin 875-125- 1 po Q 12 hours x 7 days  Oxygen 2L [place] continuous for respiratory support  Family/ staff Communication:   Total Time: 25 minutes  Documentation: 15 minutes  Face to Face: 10 minutes  Family/Phone:   Labs/tests ordered: 2v CXR  Brynda Rim, NP-C Geriatrics [facility] [address], [place] [address] Cell Phone (Mon-Fri 8am-5pm):  [phone] On Call:  [phone] & follow prompts after 5pm & weekends Office Phone:  [phone] Office Fax:  [phone]

## 2015-11-03 DIAGNOSIS — R197 Diarrhea, unspecified: Secondary | ICD-10-CM | POA: Diagnosis present

## 2015-11-03 LAB — C DIFFICILE QUICK SCREEN W PCR REFLEX
C DIFFICILE (CDIFF) INTERP: NOT DETECTED
C Diff antigen: NEGATIVE
C Diff toxin: NEGATIVE

## 2015-11-11 ENCOUNTER — Encounter
Admission: RE | Admit: 2015-11-11 | Discharge: 2015-11-11 | Disposition: A | Discharge status: Home / Self Care | Payer: Medicare Other | Source: Ambulatory Visit | Attending: Internal Medicine | Admitting: Internal Medicine

## 2015-12-02 ENCOUNTER — Non-Acute Institutional Stay (SKILLED_NURSING_FACILITY): Payer: Medicare Other | Admitting: Gerontology

## 2015-12-02 DIAGNOSIS — F0391 Unspecified dementia with behavioral disturbance: Secondary | ICD-10-CM

## 2015-12-02 DIAGNOSIS — G20 Parkinson's disease: Secondary | ICD-10-CM | POA: Diagnosis not present

## 2015-12-02 DIAGNOSIS — F03918 Unspecified dementia, unspecified severity, with other behavioral disturbance: Secondary | ICD-10-CM

## 2015-12-02 DIAGNOSIS — L89152 Pressure ulcer of sacral region, stage 2: Secondary | ICD-10-CM | POA: Diagnosis not present

## 2015-12-07 NOTE — Progress Notes (Signed)
Location:      Place of Service:  SNF (31) Provider:  Lorenso QuarryShannon Sulayman Manning, NP-C  No primary care provider on file.  No care team member to display  Extended Emergency Contact Information Primary Emergency Contact: [facility]JOHN K Address: [address]          [place], [place] [address] Home Phone: [phone] Relation: None  Code Status:  DNR Goals of care: Advanced Directive information No flowsheet data found.   Chief Complaint  Patient presents with  . Acute Visit    HPI:  Pt is a 82 y.o. female seen today for medical management of chronic diseases. She has diagnoses of Parkinson' Disease and Senile Dementia. There is no medical treatment for the Parkinson's at this time as the tremors are at a minimal. The Senile Dementia has manifestations of mood disorders. Pt is managed for comfort of herself and the staff for the mood disorder. She has no complaints. Denies pain    No past medical history on file. No past surgical history on file.  Allergies not on file    Medication List    as of 12/02/2015 11:59 PM   You have not been prescribed any medications.     Review of Systems  Unable to perform ROS: Dementia  Constitutional: Positive for fatigue. Negative for activity change, appetite change and unexpected weight change.  HENT: Negative.   Respiratory: Negative for cough, choking, chest tightness and shortness of breath.   Cardiovascular: Negative for chest pain, palpitations and leg swelling.  Gastrointestinal: Negative.   Genitourinary: Negative.   Musculoskeletal: Negative.   Skin: Positive for pallor.  Neurological: Negative.   Psychiatric/Behavioral: Positive for agitation (baseline). Behavioral problem: baseline.  All other systems reviewed and are negative.    There is no immunization history on file for this patient. Pertinent  Health Maintenance Due  Topic Date Due  . DEXA SCAN  12/15/1987  . PNA vac Low Risk Adult (1 of 2 - PCV13) 12/15/1987    . INFLUENZA VACCINE  11/11/2015   No flowsheet data found. Functional Status Survey:    Vitals:   11/27/15 0500  BP: 121/70  Pulse: 82  Resp: 18  Temp: 98.2 F (36.8 C)  SpO2: 96%  Weight: 81 lb 4.8 oz (36.9 kg)   There is no height or weight on file to calculate BMI. Physical Exam  Constitutional: She is oriented to person, place, and time. Vital signs are normal. She appears well-developed. She appears cachectic. She appears ill. No distress.  HENT:  Head: Normocephalic and atraumatic.  Eyes: Conjunctivae and EOM are normal. Pupils are equal, round, and reactive to light.  Neck: Normal range of motion. No JVD present.  Cardiovascular: Normal rate, regular rhythm and intact distal pulses.  Exam reveals no gallop and no friction rub.   No murmur heard. Pulmonary/Chest: Effort normal and breath sounds normal. No respiratory distress. She has no wheezes. She has no rales. She exhibits no tenderness.  Abdominal: Soft. Bowel sounds are normal. She exhibits no distension. There is no tenderness.  Musculoskeletal: Normal range of motion. She exhibits no edema or tenderness.  Lymphadenopathy:    She has no cervical adenopathy.  Neurological: She is alert and oriented to person, place, and time.  Skin: Skin is warm and dry. No rash noted. She is not diaphoretic. No erythema. No pallor.     Stage II to coccyx. Covered with Allevyn dressing. Improved condition with use of allevyn and barrier cream  Psychiatric: She has a  normal mood and affect. Her behavior is normal. Judgment and thought content normal.  Nursing note and vitals reviewed.   Labs reviewed:  Recent Labs  01/14/15 0904 04/22/15 0933 09/12/15 0915  NA 140 142 140  K 3.9 4.2 3.9  CL 105 106 103  CO2 27 28 29   GLUCOSE 84 77 149*  BUN 15 17 24*  CREATININE 0.70 0.53 0.83  CALCIUM 8.9 9.1 9.8  MG 2.1  --   --     Recent Labs  01/14/15 0904 04/22/15 0933 09/12/15 0915  AST 18 14* 74*  ALT 12* 8* 76*   ALKPHOS 66 58 335*  BILITOT 0.7 0.6 0.6  PROT 6.6 6.9 6.2*  ALBUMIN 3.7 4.0 3.4*    Recent Labs  01/14/15 0904 04/22/15 0933 09/12/15 0915  WBC 12.8* 8.1 6.2  NEUTROABS 9.0* 4.8 3.5  HGB 11.1* 10.1* 11.7*  HCT 34.6* 32.4* 36.7  MCV 84.5 79.3* 84.7  PLT 337 369 277   Lab Results  Component Value Date   TSH 4.278 06/05/2015   Lab Results  Component Value Date   HGBA1C 5.4 12/04/2013   Lab Results  Component Value Date   CHOL 146 06/22/2011   HDL 29 (L) 06/22/2011   LDLCALC 91 06/22/2011   TRIG 128 06/22/2011    Significant Diagnostic Results in last 30 days:  No results found.  Assessment/Plan 1. Parkinson disease (HCC)  Minimal tremors  No treatment at this time  Monitor for worsening symptoms  2. Senile dementia, uncomplicated  Abilify 7 mg po Q day  Melatonin 3 mg po Q Hs for sleep cycle  3. Decubitus ulcer of coccyx, stage II  Allevyn dressing to wound. Change Q 3 days  Liberal amount to barrier cream BID, prn  [employer] Communication:   Total Time: 25 minutes  Documentation: 15 minutes  Face to Face: 10 minutes  Family/Phone: Assessed pt with Neysa Bonito, Hospice RN   Labs/tests ordered:  none   Brynda Rim, NP-C Geriatrics [facility] [address] [place] [place], [place] [address] Cell Phone (Mon-Fri 8am-5pm):  [phone] On Call:  [phone] & follow prompts after 5pm & weekends Office Phone:  [phone] Office Fax:  [phone]

## 2015-12-12 ENCOUNTER — Encounter
Admission: RE | Admit: 2015-12-12 | Payer: Medicare Other | Source: Ambulatory Visit | Attending: Internal Medicine | Admitting: Internal Medicine

## 2015-12-12 ENCOUNTER — Encounter
Admission: RE | Admit: 2015-12-12 | Discharge: 2015-12-12 | Disposition: A | Discharge status: Home / Self Care | Payer: Medicare Other | Source: Ambulatory Visit | Attending: Internal Medicine | Admitting: Internal Medicine

## 2015-12-12 DIAGNOSIS — F329 Major depressive disorder, single episode, unspecified: Secondary | ICD-10-CM | POA: Insufficient documentation

## 2016-01-11 ENCOUNTER — Encounter
Admission: RE | Admit: 2016-01-11 | Discharge: 2016-01-11 | Disposition: A | Discharge status: Home / Self Care | Payer: Medicare Other | Source: Ambulatory Visit | Attending: Internal Medicine | Admitting: Internal Medicine

## 2016-02-11 ENCOUNTER — Encounter
Admission: RE | Admit: 2016-02-11 | Discharge: 2016-02-11 | Disposition: A | Discharge status: Home / Self Care | Payer: Medicare Other | Source: Ambulatory Visit | Attending: Internal Medicine | Admitting: Internal Medicine

## 2016-03-12 ENCOUNTER — Encounter
Admission: RE | Admit: 2016-03-12 | Discharge: 2016-03-12 | Disposition: A | Discharge status: Home / Self Care | Payer: Medicare Other | Source: Ambulatory Visit | Attending: Internal Medicine | Admitting: Internal Medicine

## 2016-03-17 ENCOUNTER — Non-Acute Institutional Stay (SKILLED_NURSING_FACILITY): Payer: Medicare Other | Admitting: Gerontology

## 2016-03-17 DIAGNOSIS — F0391 Unspecified dementia with behavioral disturbance: Secondary | ICD-10-CM

## 2016-03-17 DIAGNOSIS — R197 Diarrhea, unspecified: Secondary | ICD-10-CM | POA: Diagnosis not present

## 2016-03-17 DIAGNOSIS — G20 Parkinson's disease: Secondary | ICD-10-CM | POA: Diagnosis not present

## 2016-03-17 DIAGNOSIS — L89154 Pressure ulcer of sacral region, stage 4: Secondary | ICD-10-CM | POA: Diagnosis not present

## 2016-03-17 DIAGNOSIS — F03918 Unspecified dementia, unspecified severity, with other behavioral disturbance: Secondary | ICD-10-CM

## 2016-03-29 DIAGNOSIS — L89154 Pressure ulcer of sacral region, stage 4: Secondary | ICD-10-CM | POA: Insufficient documentation

## 2016-03-29 DIAGNOSIS — R197 Diarrhea, unspecified: Secondary | ICD-10-CM | POA: Insufficient documentation

## 2016-03-29 NOTE — Progress Notes (Signed)
Location:      Place of Service:  SNF (31) Provider:  Lorenso QuarryShannon Shunda Rabadi, NP-C  No primary care provider on file.  No care team member to display  Extended Emergency Contact Information Primary Emergency Contact: [facility] Address: [address]          [place], [place] [address] Home Phone: [phone] Relation: None  Code Status:  DNR Goals of care: Advanced Directive information No flowsheet data found.   Chief Complaint  Patient presents with  . Acute Visit    HPI:  Pt is a 82 y.o. female seen today for medical management of chronic diseases as well as acute onset of diarrhea. The stool is contaminating the sacral wound. She has diagnoses of Parkinson' Disease and Senile Dementia. There is no medical treatment for the Parkinson's at this time as the tremors are at a minimal. The Senile Dementia has manifestations of mood disorders. Pt is managed for comfort of herself and the staff for the mood disorder. She has no complaints. She is grateful for interventions to decrease the diarrhea. She continue to refuse supplements. On an airmattress. Denies pain. VSS    No past medical history on file. No past surgical history on file.  Allergies not on file  Allergies as of 03/17/2016   Not on File     Medication List    as of 03/17/2016 11:59 PM   You have not been prescribed any medications.     Review of Systems  Unable to perform ROS: Dementia  Constitutional: Positive for fatigue. Negative for activity change, appetite change and unexpected weight change.  HENT: Negative.   Respiratory: Negative for cough, choking, chest tightness and shortness of breath.   Cardiovascular: Negative for chest pain, palpitations and leg swelling.  Gastrointestinal: Positive for diarrhea.  Genitourinary: Negative.   Musculoskeletal: Negative.   Skin: Positive for pallor and wound.  Neurological: Negative.   Psychiatric/Behavioral: Positive for agitation (baseline). Behavioral  problem: baseline.  All other systems reviewed and are negative.    There is no immunization history on file for this patient. Pertinent  Health Maintenance Due  Topic Date Due  . DEXA SCAN  12/15/1987  . PNA vac Low Risk Adult (1 of 2 - PCV13) 12/15/1987  . INFLUENZA VACCINE  11/11/2015   No flowsheet data found. Functional Status Survey:    Vitals:   03/15/16 1656  SpO2: 95%  Weight: 79 lb 9.6 oz (36.1 kg)   There is no height or weight on file to calculate BMI. Physical Exam  Constitutional: She is oriented to person, place, and time. Vital signs are normal. She appears well-developed. She appears cachectic. She appears ill. No distress.  HENT:  Head: Normocephalic and atraumatic.  Eyes: Conjunctivae and EOM are normal. Pupils are equal, round, and reactive to light.  Neck: Normal range of motion. No JVD present.  Cardiovascular: Normal rate, regular rhythm and intact distal pulses.  Exam reveals no gallop and no friction rub.   No murmur heard. Pulmonary/Chest: Effort normal and breath sounds normal. No respiratory distress. She has no wheezes. She has no rales. She exhibits no tenderness.  Abdominal: Soft. Bowel sounds are normal. She exhibits no distension and no mass. There is no tenderness. There is no rebound. No hernia.  Musculoskeletal: Normal range of motion. She exhibits no edema or tenderness.  Lymphadenopathy:    She has no cervical adenopathy.  Neurological: She is alert and oriented to person, place, and time.  Skin: Skin is warm  and dry. No rash noted. She is not diaphoretic. No erythema. No pallor.     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 Covered with NS wet to dry dressing. Improved condition initially with use of allevyn and barrier cream, but has worsening significantly as of late d/t stool contamination, poor nutritional status/ refusal of supplements, etc.  Psychiatric: She has a normal mood and affect. Her behavior is normal. Judgment and thought content normal.    Nursing note and vitals reviewed.   Labs reviewed:  Recent Labs  04/22/15 0933 09/12/15 0915  NA 142 140  K 4.2 3.9  CL 106 103  CO2 28 29  GLUCOSE 77 149*  BUN 17 24*  CREATININE 0.53 0.83  CALCIUM 9.1 9.8    Recent Labs  04/22/15 0933 09/12/15 0915  AST 14* 74*  ALT 8* 76*  ALKPHOS 58 335*  BILITOT 0.6 0.6  PROT 6.9 6.2*  ALBUMIN 4.0 3.4*    Recent Labs  04/22/15 0933 09/12/15 0915  WBC 8.1 6.2  NEUTROABS 4.8 3.5  HGB 10.1* 11.7*  HCT 32.4* 36.7  MCV 79.3* 84.7  PLT 369 277   Lab Results  Component Value Date   TSH 4.278 06/05/2015   Lab Results  Component Value Date   HGBA1C 5.4 12/04/2013   Lab Results  Component Value Date   CHOL 146 06/22/2011   HDL 29 (L) 06/22/2011   LDLCALC 91 06/22/2011   TRIG 128 06/22/2011    Significant Diagnostic Results in last 30 days:  No results found.  Assessment/Plan 1. Parkinson disease (HCC)  Minimal tremors  No treatment at this time  Monitor for worsening symptoms  2. Senile dementia, uncomplicated  Abilify 7 mg po Q day  Melatonin 3 mg po Q Hs for sleep cycle  Continue Lamictal 100 mg BID  Continue selexa 20 mg daily  3. Decubitus ulcer of coccyx, stage IV  Wet to dry dressing to wound with attention to tunneling and undermining. Cover with ABD pad and tape. Change Q day and prn  Use skin prep on the peri-wound skin for protection from tape  Liberal amount to barrier cream BID, prn  Air Mattress  Nutritional supplements as she is willing to accept  4. Diarrhea, unspecified type  Schedule Imodium 2 mg capsule TID until stools are more formed, then may change to prn  Monitor closely for wound contamination   Family/ staff Communication:   Total Time: 25 minutes  Documentation: 15 minutes  Face to Face: 10 minutes  Family/Phone: Assessed pt with Neysa Bonitohristy, Hospice RN   Labs/tests ordered:  none   Brynda RimShannon H. Bonny Egger, NP-C [facility] [address] [place], [place] [address] Cell Phone (Mon-Fri 8am-5pm):  [phone] On Call:  [phone] & follow prompts after 5pm & weekends Office Phone:  [phone] Office Fax:  [phone]

## 2016-04-07 ENCOUNTER — Non-Acute Institutional Stay (SKILLED_NURSING_FACILITY): Payer: Medicare Other | Admitting: Gerontology

## 2016-04-07 DIAGNOSIS — R52 Pain, unspecified: Secondary | ICD-10-CM

## 2016-04-07 DIAGNOSIS — R1310 Dysphagia, unspecified: Secondary | ICD-10-CM | POA: Diagnosis not present

## 2016-04-10 NOTE — Progress Notes (Signed)
Location:      Place of Service:  SNF (31) Provider:  Lorenso QuarryShannon Pecolia Marando, NP-C  No primary care provider on file.  No care team member to display  Extended Emergency Contact Information Primary Emergency Contact: [facility] Address: [address]          [place], [place] [address] Home Phone: [phone] Relation: None  Code Status:  dnr Goals of care: Advanced Directive information No flowsheet data found.   Chief Complaint  Patient presents with  . Acute Visit    HPI:  Pt is a 82 y.o. female seen today for an acute visit for worsening dysphagia/ inability to swallow medications and generalized pain. Pt appears to be transitioning into actively dying. She is still verbal and oriented. She is lethargic but still answers questions appropriately. Pt verbalizes intermittent generalized pain and dyspnea. Able to swallow small sips of water. Hospice nursing updated on condition.     No past medical history on file. No past surgical history on file.  Allergies not on file  Allergies as of 04/07/2016   Not on File     Medication List    as of 04/07/2016 11:59 PM   You have not been prescribed any medications.     Review of Systems  Unable to perform ROS: Dementia  Constitutional: Positive for fatigue. Negative for activity change, appetite change and unexpected weight change.  HENT: Negative.   Respiratory: Negative for cough, choking, chest tightness and shortness of breath.   Cardiovascular: Negative for chest pain, palpitations and leg swelling.  Gastrointestinal: Negative for diarrhea.  Genitourinary: Negative.   Musculoskeletal: Negative.   Skin: Positive for pallor and wound.  Neurological: Negative.   All other systems reviewed and are negative.    There is no immunization history on file for this patient. Pertinent  Health Maintenance Due  Topic Date Due  . DEXA SCAN  12/15/1987  . PNA vac Low Risk Adult (1 of 2 - PCV13) 12/15/1987  . INFLUENZA  VACCINE  11/11/2015   No flowsheet data found. Functional Status Survey:    There were no vitals filed for this visit. There is no height or weight on file to calculate BMI. Physical Exam  Constitutional: She is oriented to person, place, and time. Vital signs are normal. She appears well-developed. She appears cachectic. She appears ill. No distress.  HENT:  Head: Normocephalic and atraumatic.  Eyes: Conjunctivae and EOM are normal. Pupils are equal, round, and reactive to light.  Neck: Normal range of motion. No JVD present.  Cardiovascular: Normal rate, regular rhythm and intact distal pulses.  Exam reveals distant heart sounds. Exam reveals no gallop and no friction rub.   No murmur heard. Pulmonary/Chest: Effort normal and breath sounds normal. No respiratory distress. She has no wheezes. She has no rales. She exhibits no tenderness.  Abdominal: Soft. Bowel sounds are normal. She exhibits no distension and no mass. There is no tenderness. There is no rebound. No hernia.  Musculoskeletal: Normal range of motion. She exhibits no edema or tenderness.  Lymphadenopathy:    She has no cervical adenopathy.  Neurological: She is alert and oriented to person, place, and time.  Skin: Skin is warm and dry. No rash noted. She is not diaphoretic. No erythema. No pallor.     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 Covered with NS wet to dry dressing. Improved condition initially with use of allevyn and barrier cream, but has worsening significantly as of late d/t stool contamination, poor nutritional status/  refusal of supplements, etc.  Psychiatric: She has a normal mood and affect. Her behavior is normal. Judgment and thought content normal.  Nursing note and vitals reviewed.   Labs reviewed:  Recent Labs  04/22/15 0933 09/12/15 0915  NA 142 140  K 4.2 3.9  CL 106 103  CO2 28 29  GLUCOSE 77 149*  BUN 17 24*  CREATININE 0.53 0.83  CALCIUM 9.1 9.8    Recent Labs  04/22/15 0933 09/12/15 0915  AST  14* 74*  ALT 8* 76*  ALKPHOS 58 335*  BILITOT 0.6 0.6  PROT 6.9 6.2*  ALBUMIN 4.0 3.4*    Recent Labs  04/22/15 0933 09/12/15 0915  WBC 8.1 6.2  NEUTROABS 4.8 3.5  HGB 10.1* 11.7*  HCT 32.4* 36.7  MCV 79.3* 84.7  PLT 369 277   Lab Results  Component Value Date   TSH 4.278 06/05/2015   Lab Results  Component Value Date   HGBA1C 5.4 12/04/2013   Lab Results  Component Value Date   CHOL 146 06/22/2011   HDL 29 (L) 06/22/2011   LDLCALC 91 06/22/2011   TRIG 128 06/22/2011    Significant Diagnostic Results in last 30 days:  No results found.  Assessment/Plan 1. Dysphagia, unspecified type 2. Generalized pain  DC Oxycodone TID  Morphine concentrate 20 mg/ mL- 0.25-0.5 mL PO Q 1 hour prn- pain, dyspnea  Morphine concentrate 20 mg/ mL 0.25 mL po QID scheduled  Family/ staff Communication:   Total Time:  Documentation:  Face to Face:  Family/Phone:   Labs/tests ordered:    Medication list reviewed and assessed for continued appropriateness.  Brynda RimShannon H. Ralene Gasparyan, NP-C [facility] [phone] [place], [place] [address] Cell Phone (Mon-Fri 8am-5pm):  [phone] On Call:  [phone] & follow prompts after 5pm & weekends Office Phone:  [phone] Office Fax:  [phone]

## 2016-04-12 ENCOUNTER — Encounter: Admission: RE | Admit: 2016-04-12 | Source: Ambulatory Visit | Admitting: Internal Medicine

## 2016-04-12 DEATH — deceased
